# Patient Record
Sex: Female | Born: 1955 | Race: White | Hispanic: No | Marital: Married | Smoking: Never smoker
Health system: Southern US, Community
[De-identification: ages and names within clinical notes are randomized; demographics above are authoritative.]

## PROBLEM LIST (undated history)

## (undated) DIAGNOSIS — G4700 Insomnia, unspecified: Secondary | ICD-10-CM

## (undated) DIAGNOSIS — A048 Other specified bacterial intestinal infections: Secondary | ICD-10-CM

## (undated) DIAGNOSIS — J329 Chronic sinusitis, unspecified: Secondary | ICD-10-CM

## (undated) DIAGNOSIS — I8390 Asymptomatic varicose veins of unspecified lower extremity: Secondary | ICD-10-CM

## (undated) DIAGNOSIS — L209 Atopic dermatitis, unspecified: Secondary | ICD-10-CM

## (undated) DIAGNOSIS — K589 Irritable bowel syndrome without diarrhea: Secondary | ICD-10-CM

## (undated) DIAGNOSIS — I10 Essential (primary) hypertension: Secondary | ICD-10-CM

## (undated) HISTORY — DX: Chronic sinusitis, unspecified: J32.9

## (undated) HISTORY — DX: Irritable bowel syndrome, unspecified: K58.9

## (undated) HISTORY — DX: Insomnia, unspecified: G47.00

## (undated) HISTORY — PX: CATARACT EXTRACTION: SUR2

## (undated) HISTORY — DX: Atopic dermatitis, unspecified: L20.9

## (undated) HISTORY — DX: Asymptomatic varicose veins of unspecified lower extremity: I83.90

## (undated) HISTORY — DX: Other specified bacterial intestinal infections: A04.8

---

## 2002-10-02 ENCOUNTER — Other Ambulatory Visit: Admission: RE | Admit: 2002-10-02 | Discharge: 2002-10-02 | Payer: Self-pay | Admitting: Obstetrics and Gynecology

## 2002-10-25 ENCOUNTER — Encounter: Admission: RE | Admit: 2002-10-25 | Discharge: 2002-10-25 | Payer: Self-pay | Admitting: Obstetrics and Gynecology

## 2002-10-25 ENCOUNTER — Encounter: Payer: Self-pay | Admitting: Obstetrics and Gynecology

## 2005-02-04 ENCOUNTER — Other Ambulatory Visit: Admission: RE | Admit: 2005-02-04 | Discharge: 2005-02-04 | Payer: Self-pay | Admitting: Obstetrics and Gynecology

## 2005-11-30 ENCOUNTER — Encounter: Admission: RE | Admit: 2005-11-30 | Discharge: 2005-11-30 | Payer: Self-pay | Admitting: Obstetrics and Gynecology

## 2007-08-07 ENCOUNTER — Encounter: Admission: RE | Admit: 2007-08-07 | Discharge: 2007-08-07 | Payer: Self-pay | Admitting: Obstetrics and Gynecology

## 2008-10-02 IMAGING — MG MM DIGITAL SCREENING BILAT W/ CAD
4 series · 4 of 4 positions shown · non-contrast
Comparison: Prior studies.

DG SCREEN MAMMOGRAM BILATERAL
Bilateral CC and MLO view(s) were taken.
Technologist: Salinas Ambrose
Prior study comparison: October 25, 2002, bilateral screening mammogram.

DIGITAL SCREENING MAMMOGRAM WITH CAD:

[R CC]
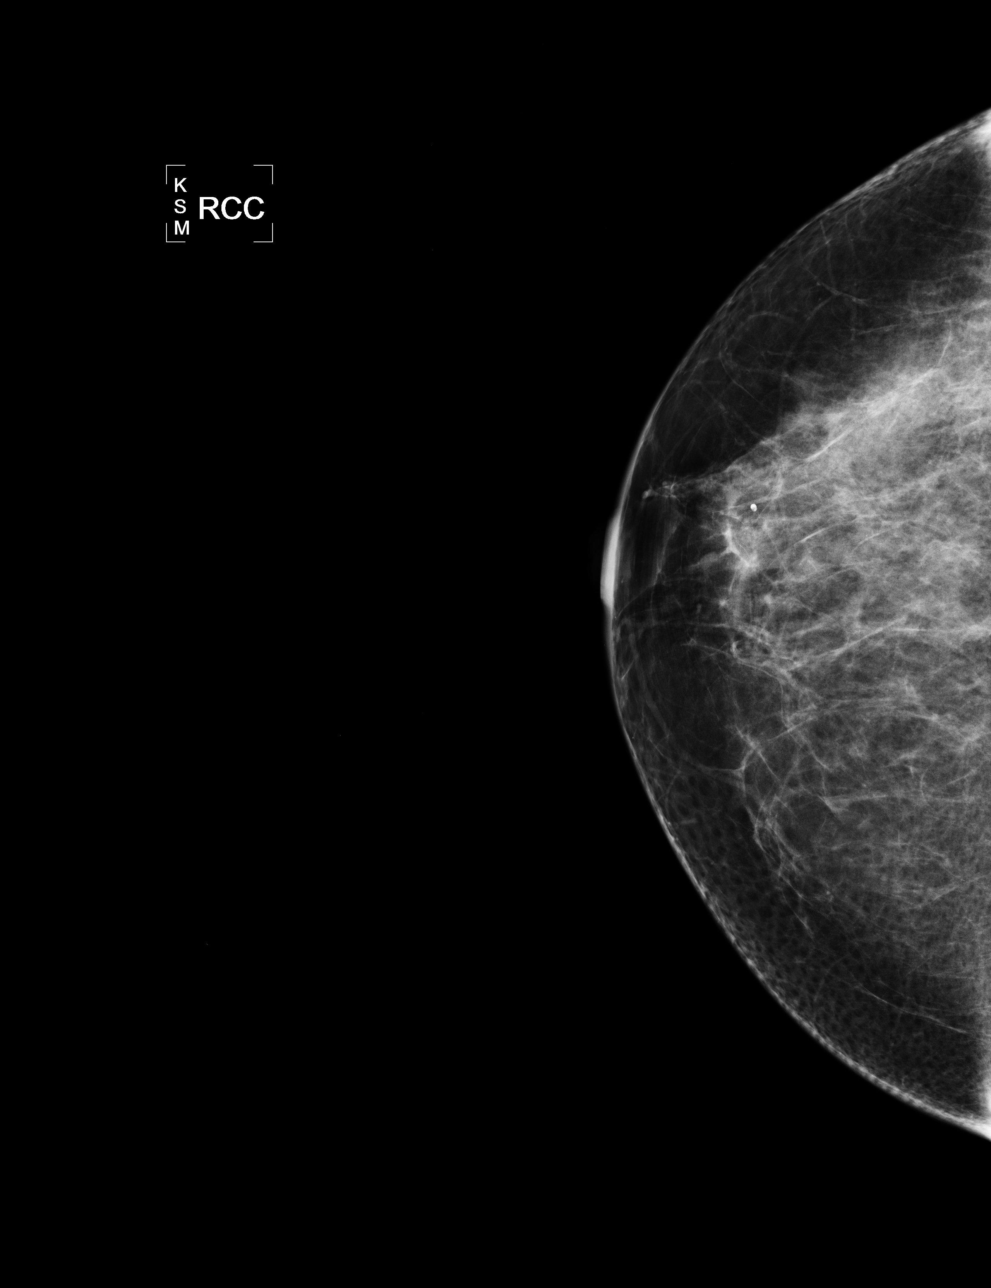

[L CC]
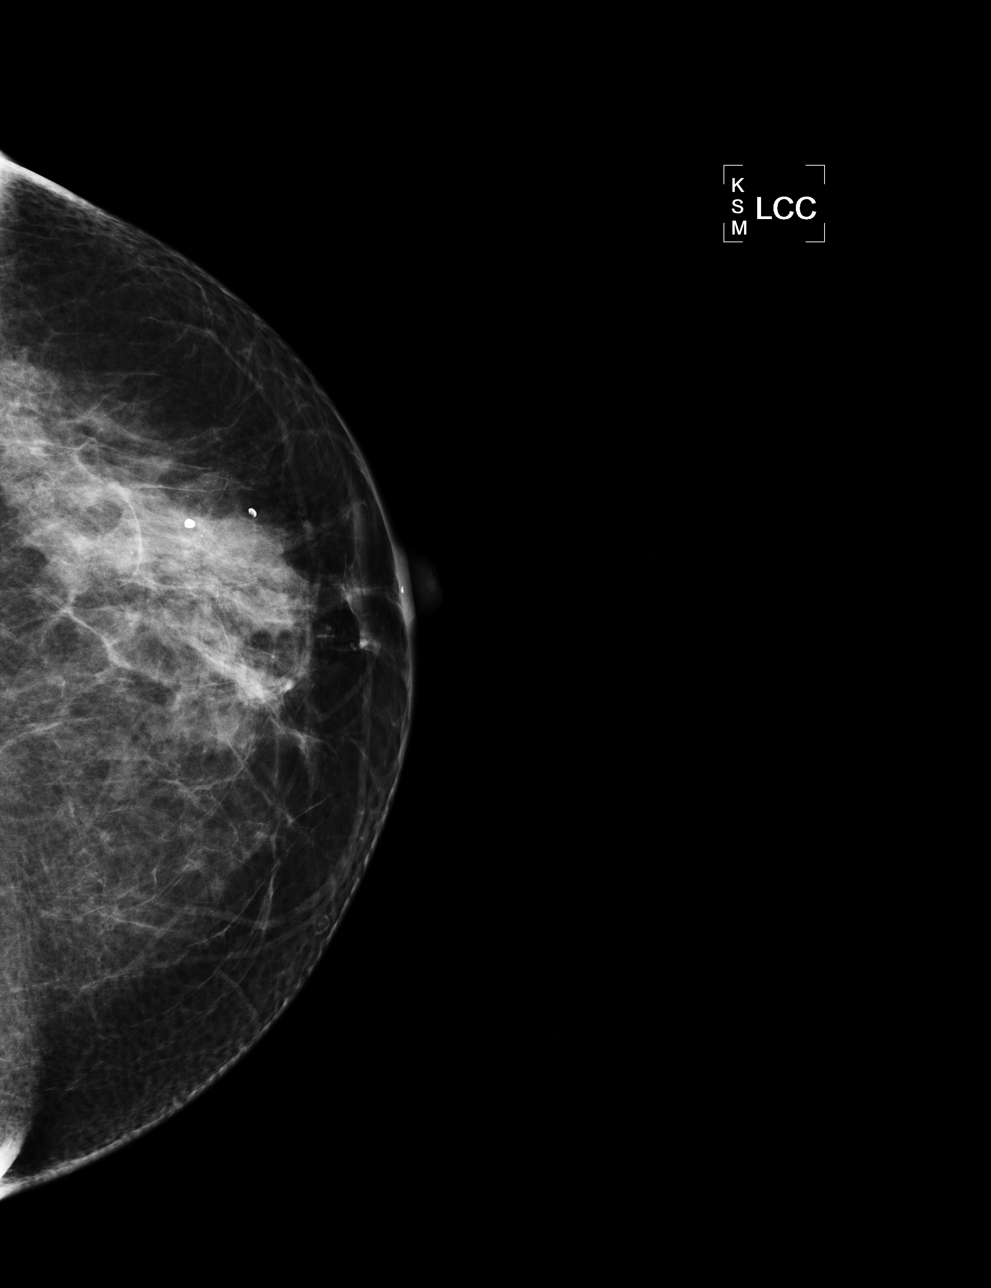

[L MLO]
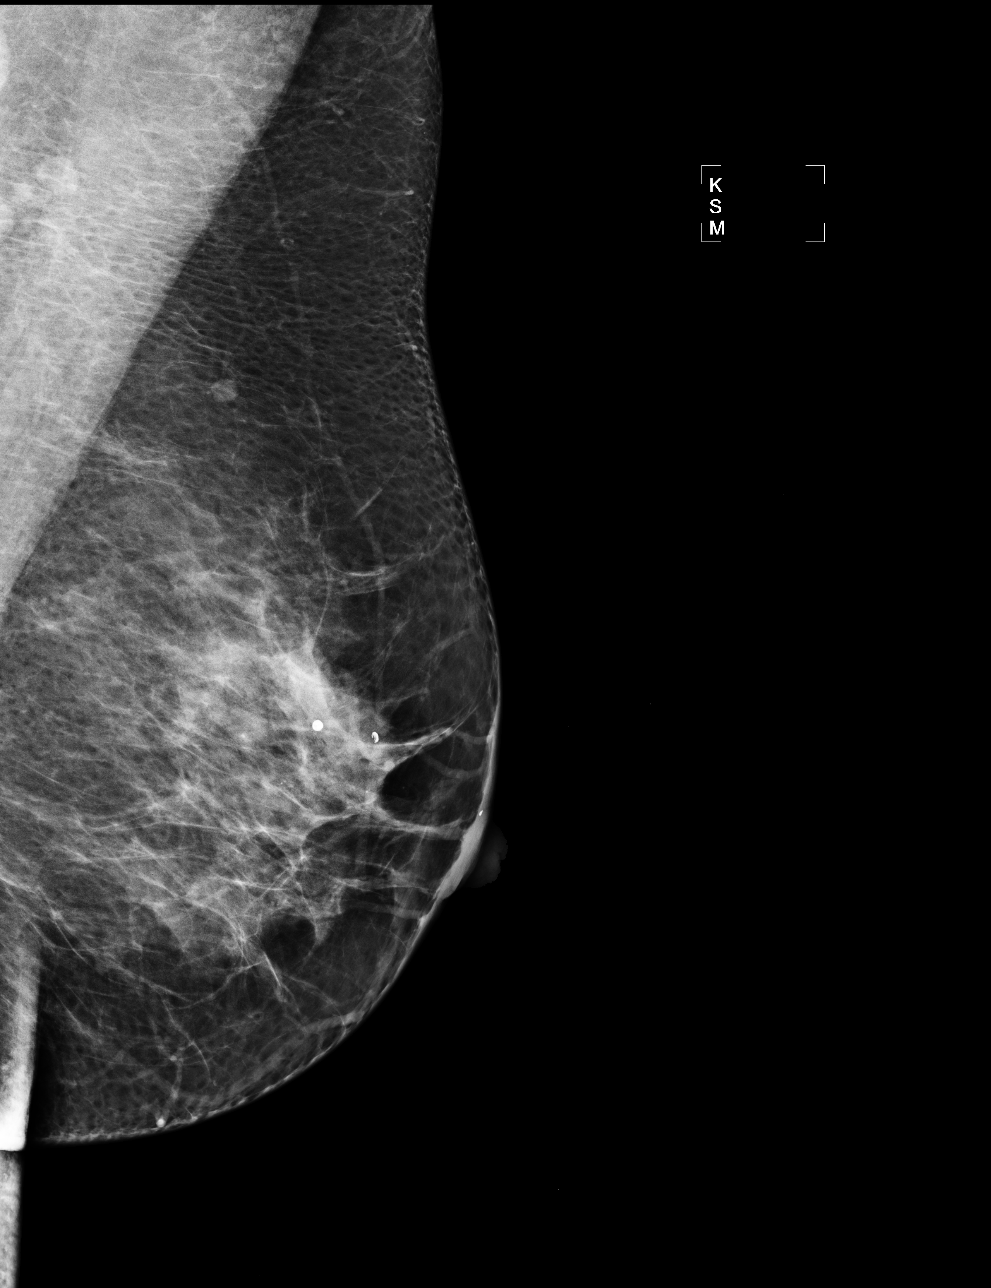

[R MLO]
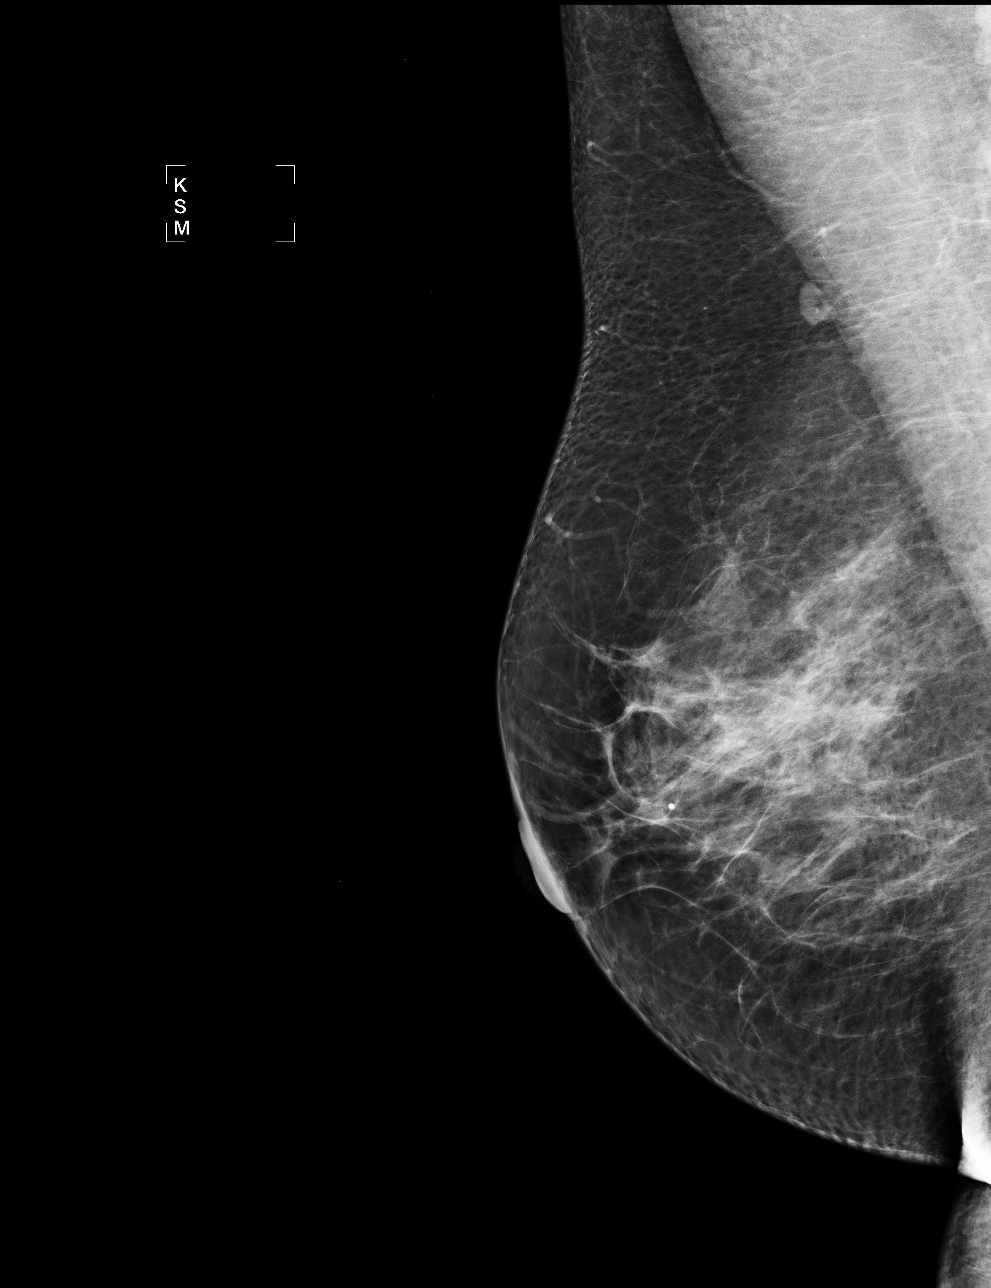

[4 of 4 positions shown; findings below may reference images not displayed]

The breast tissue is heterogeneously dense.  There is no dominant mass, architectural distortion or
calcification to suggest malignancy.
IMPRESSION: No mammographic evidence of malignancy.  Suggest yearly screening mammography.

ASSESSMENT: Negative - BI-RADS 1

Screening mammogram in 1 year.
ANALYZED BY COMPUTER AIDED DETECTION. , THIS PROCEDURE WAS A DIGITAL MAMMOGRAM.

## 2010-03-16 ENCOUNTER — Encounter: Admission: RE | Admit: 2010-03-16 | Discharge: 2010-03-16 | Payer: Self-pay | Admitting: Obstetrics and Gynecology

## 2010-10-17 ENCOUNTER — Encounter: Payer: Self-pay | Admitting: Obstetrics and Gynecology

## 2011-05-13 IMAGING — MG MM DIGITAL SCREENING BILAT W/ CAD
6 series · 6 of 6 positions shown · non-contrast
Comparison: none

DG SCREEN MAMMOGRAM BILATERAL
Bilateral CC and MLO view(s) were taken.

DIGITAL SCREENING MAMMOGRAM WITH CAD:
There are scattered fibroglandular densities.  No masses or malignant type calcifications are 
identified.  Compared with prior studies.
Images were processed with CAD.

[R CC]
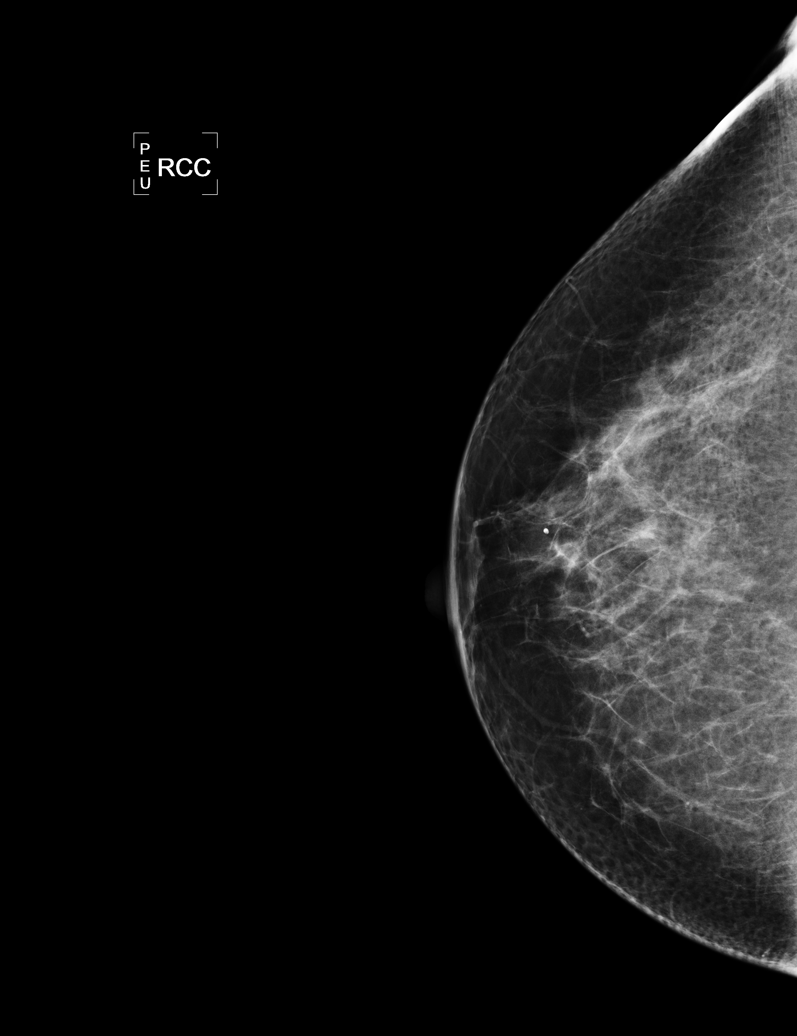

[L CC (1 of 2)]
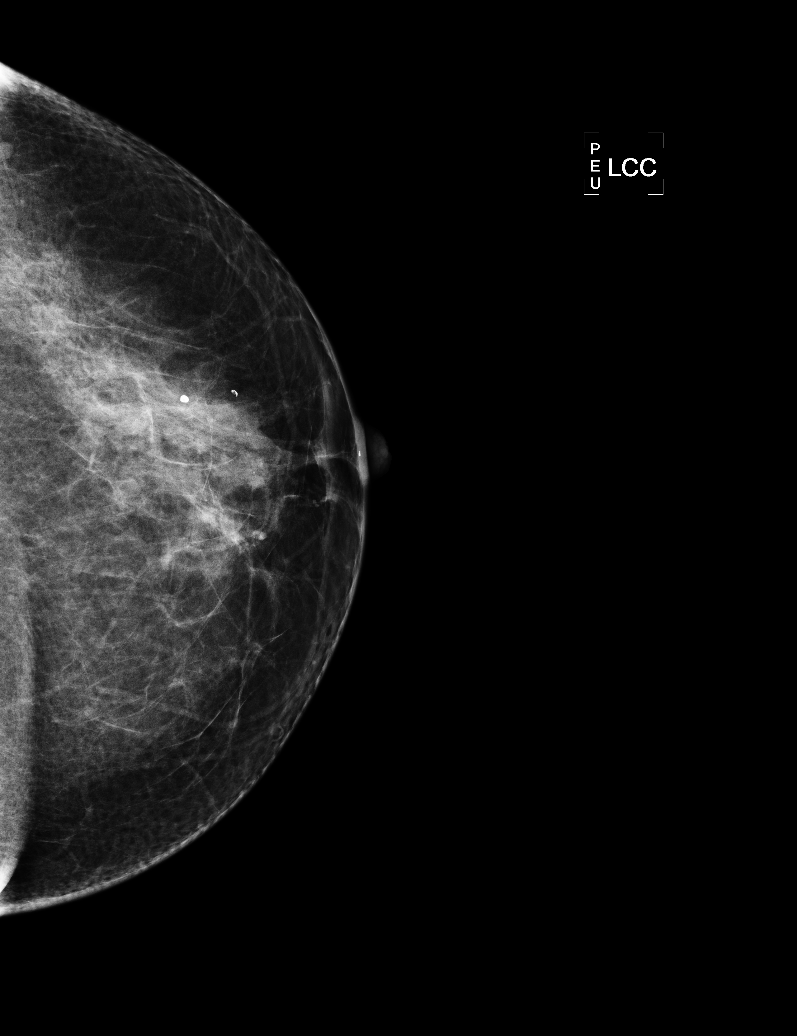

[L MLO (1 of 2)]
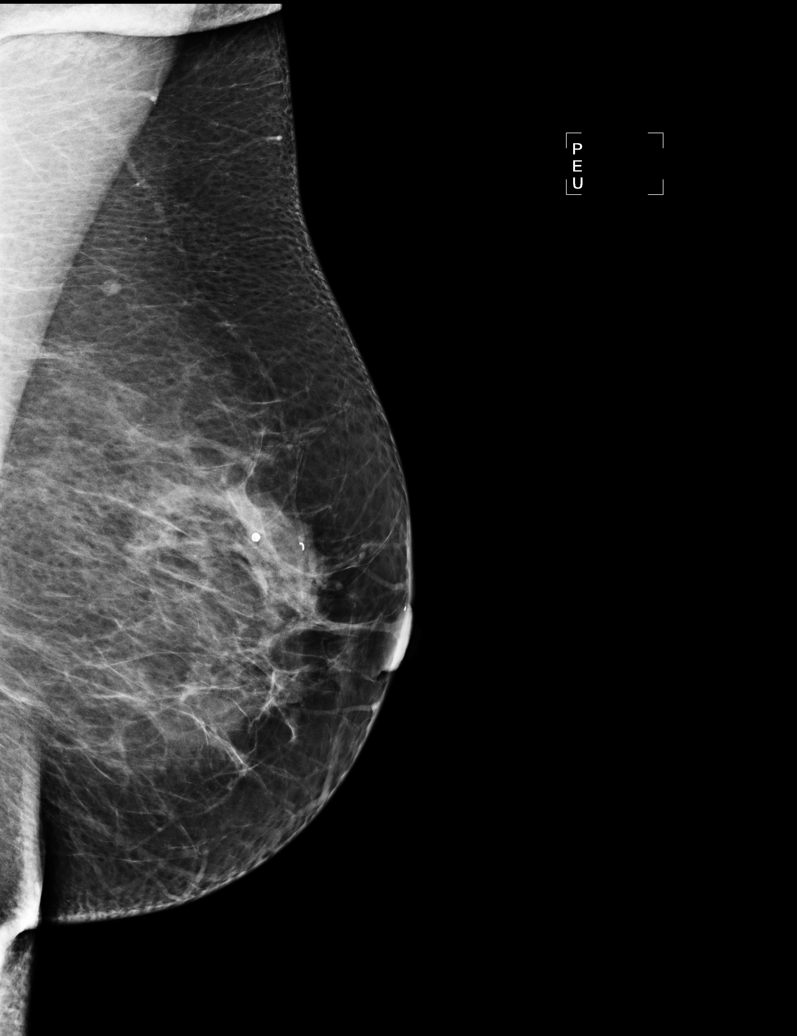

[R MLO]
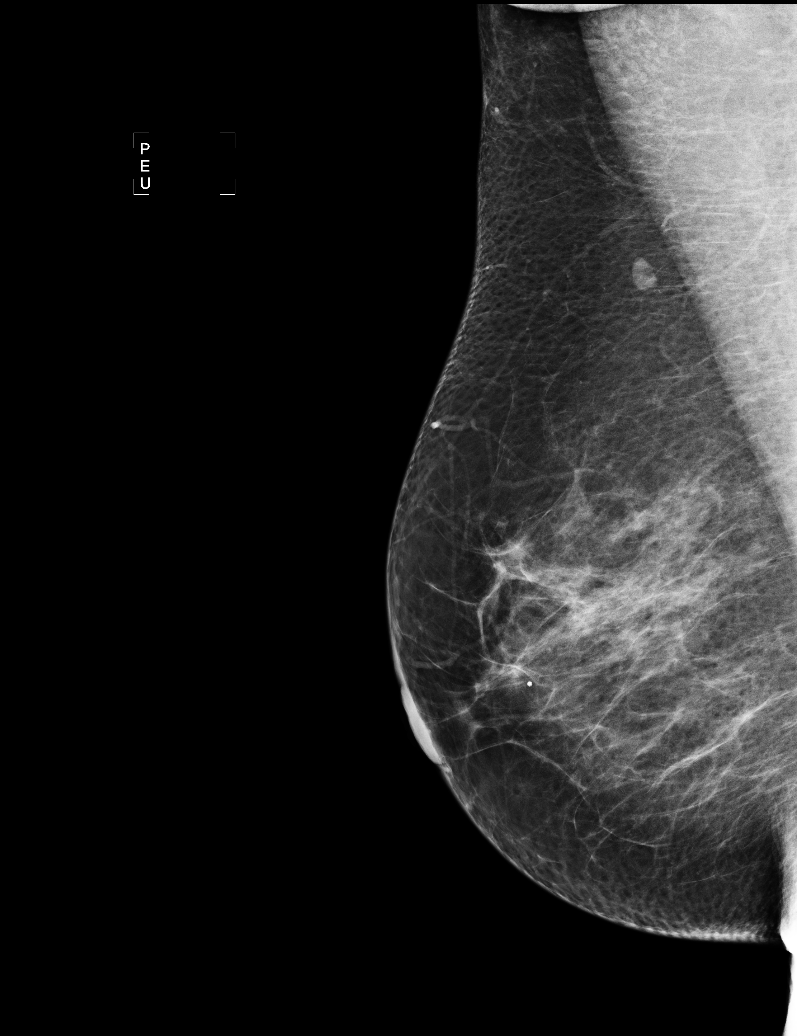

[L MLO (2 of 2)]
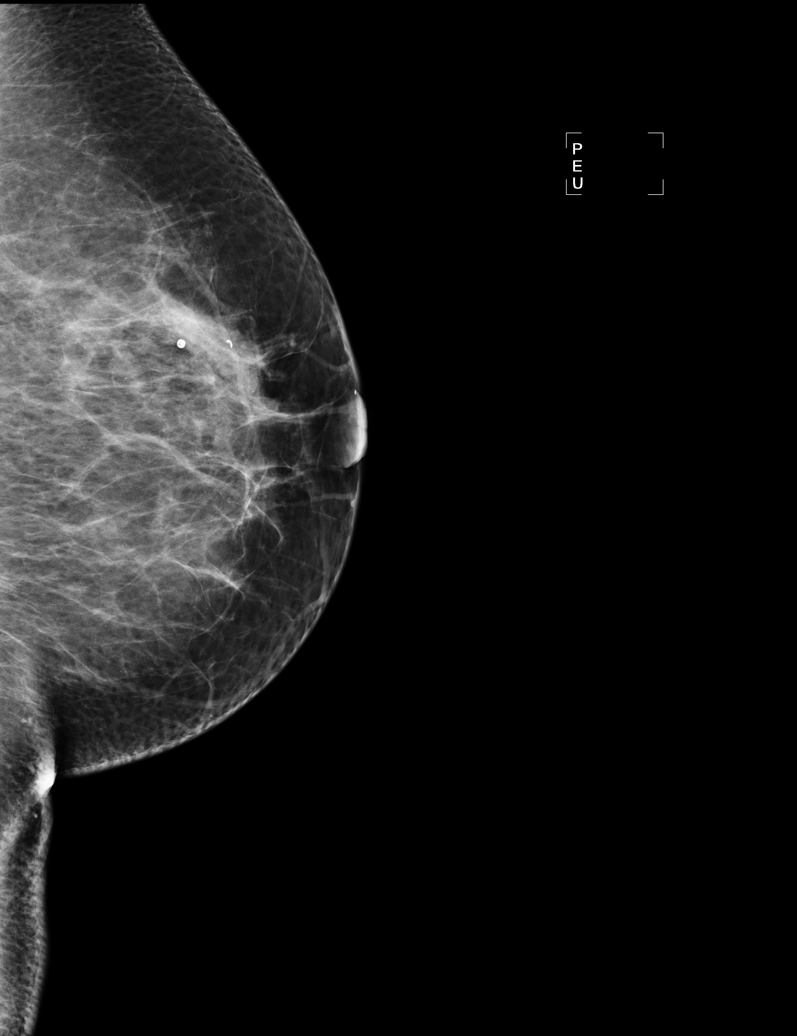

[L CC (2 of 2)]
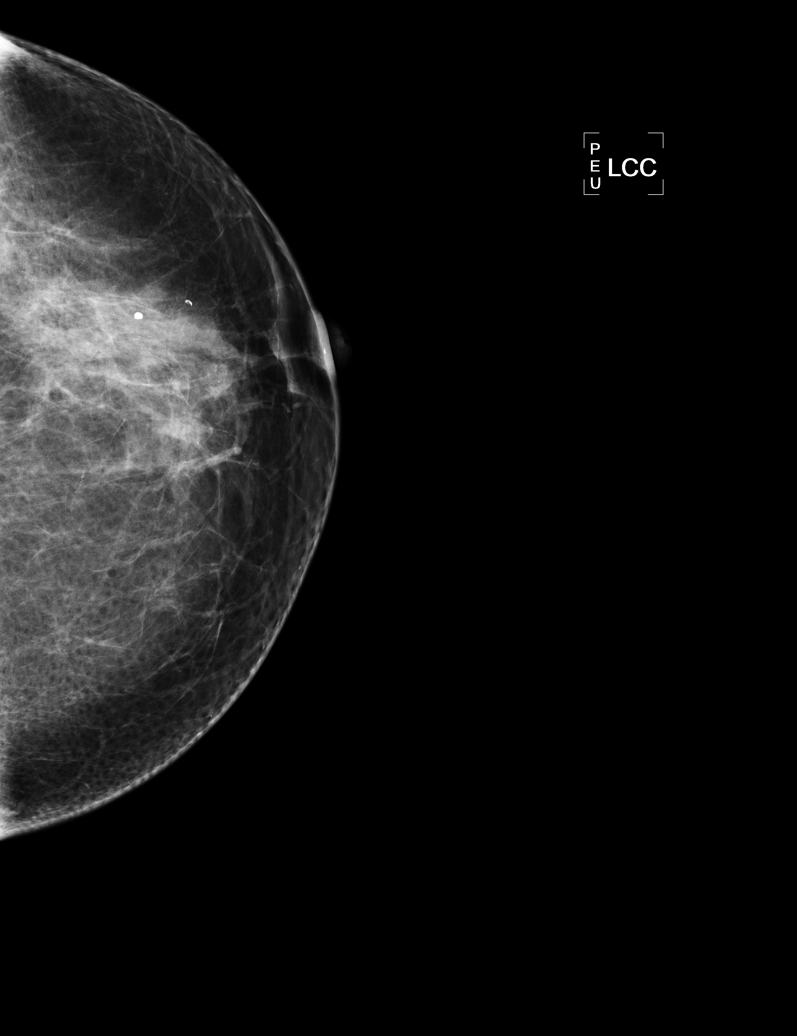

[6 of 6 positions shown; findings below may reference images not displayed]

IMPRESSION: No specific mammographic evidence of malignancy.  Next screening mammogram is recommended in one 
year.

A result letter of this screening mammogram will be mailed directly to the patient.

ASSESSMENT: Negative - BI-RADS 1

Screening mammogram in 1 year.
,

## 2013-01-09 HISTORY — PX: COLONOSCOPY: SHX5424

## 2013-01-09 HISTORY — PX: ESOPHAGOGASTRODUODENOSCOPY: SHX1529

## 2013-05-31 ENCOUNTER — Emergency Department (HOSPITAL_COMMUNITY)
Admission: EM | Admit: 2013-05-31 | Discharge: 2013-05-31 | Disposition: A | Discharge status: Home / Self Care | Payer: PRIVATE HEALTH INSURANCE | Attending: Emergency Medicine | Admitting: Emergency Medicine

## 2013-05-31 ENCOUNTER — Encounter (HOSPITAL_COMMUNITY): Payer: Self-pay | Admitting: Emergency Medicine

## 2013-05-31 DIAGNOSIS — Y9389 Activity, other specified: Secondary | ICD-10-CM | POA: Insufficient documentation

## 2013-05-31 DIAGNOSIS — I10 Essential (primary) hypertension: Secondary | ICD-10-CM | POA: Insufficient documentation

## 2013-05-31 DIAGNOSIS — T7840XA Allergy, unspecified, initial encounter: Secondary | ICD-10-CM

## 2013-05-31 DIAGNOSIS — L299 Pruritus, unspecified: Secondary | ICD-10-CM | POA: Insufficient documentation

## 2013-05-31 DIAGNOSIS — T63461A Toxic effect of venom of wasps, accidental (unintentional), initial encounter: Secondary | ICD-10-CM | POA: Insufficient documentation

## 2013-05-31 DIAGNOSIS — Y929 Unspecified place or not applicable: Secondary | ICD-10-CM | POA: Insufficient documentation

## 2013-05-31 DIAGNOSIS — R21 Rash and other nonspecific skin eruption: Secondary | ICD-10-CM | POA: Insufficient documentation

## 2013-05-31 HISTORY — DX: Essential (primary) hypertension: I10

## 2013-05-31 MED ORDER — KETOROLAC TROMETHAMINE 30 MG/ML IJ SOLN
30.0000 mg | Freq: Once | INTRAMUSCULAR | Status: AC
  Administered 2013-05-31: 30 mg via INTRAVENOUS
  Filled 2013-05-31: qty 1

## 2013-05-31 MED ORDER — SODIUM CHLORIDE 0.9 % IV BOLUS (SEPSIS)
1000.0000 mL | Freq: Once | INTRAVENOUS | Status: AC
  Administered 2013-05-31: 1000 mL via INTRAVENOUS

## 2013-05-31 MED ORDER — DIPHENHYDRAMINE HCL 25 MG PO CAPS: 25.0000 mg | ORAL_CAPSULE | Freq: Four times a day (QID) | ORAL | Status: DC | PRN

## 2013-05-31 MED ORDER — EPINEPHRINE 0.3 MG/0.3ML IJ SOAJ
0.3000 mg | Freq: Once | INTRAMUSCULAR | Status: AC
  Administered 2013-05-31: 0.3 mg via INTRAMUSCULAR

## 2013-05-31 MED ORDER — EPINEPHRINE 0.3 MG/0.3ML IJ SOAJ
INTRAMUSCULAR | Status: AC
  Administered 2013-05-31: 0.3 mg via INTRAMUSCULAR
  Filled 2013-05-31: qty 0.3

## 2013-05-31 MED ORDER — METOCLOPRAMIDE HCL 5 MG/ML IJ SOLN
10.0000 mg | Freq: Once | INTRAMUSCULAR | Status: AC
  Administered 2013-05-31: 10 mg via INTRAVENOUS
  Filled 2013-05-31: qty 2

## 2013-05-31 MED ORDER — METHYLPREDNISOLONE SODIUM SUCC 125 MG IJ SOLR
125.0000 mg | Freq: Once | INTRAMUSCULAR | Status: AC
  Administered 2013-05-31: 125 mg via INTRAVENOUS
  Filled 2013-05-31: qty 2

## 2013-05-31 MED ORDER — EPINEPHRINE 0.3 MG/0.3ML IJ SOAJ: 0.3000 mg | INTRAMUSCULAR | Status: AC | PRN

## 2013-05-31 MED ORDER — PREDNISONE 50 MG PO TABS: 50.0000 mg | ORAL_TABLET | Freq: Every day | ORAL | Status: DC

## 2013-05-31 MED ORDER — FAMOTIDINE IN NACL 20-0.9 MG/50ML-% IV SOLN
20.0000 mg | Freq: Once | INTRAVENOUS | Status: AC
  Administered 2013-05-31: 20 mg via INTRAVENOUS
  Filled 2013-05-31: qty 50

## 2013-05-31 NOTE — ED Provider Notes (Signed)
CSN: [id number]     Arrival date & time 05/31/13  1551 History   First MD Initiated Contact with Patient 05/31/13 1600     Chief Complaint  Patient presents with  . Allergic Reaction   (Consider location/radiation/quality/duration/timing/severity/associated sxs/prior Treatment) HPI Comments: Pt comes in with cc of allergies. Pt has hx of HTN, no hx of allergies. States that about an hour ago, she was bit by a yellow jacket - and then, while she was driving, she started having pruritis in her feet and hands. She then noted rah spreading all over her body, and having a "funny" sensation in her mouth. She denies any wheezing. No new meds.  Patient is a 57 y.o. female presenting with allergic reaction. The history is provided by the patient.  Allergic Reaction Presenting symptoms: rash   Presenting symptoms: no wheezing     Past Medical History  Diagnosis Date  . Hypertension    Past Surgical History  Procedure Laterality Date  . Cesarean section     No family history on file. History  Substance Use Topics  . Smoking status: Never Smoker   . Smokeless tobacco: Never Used  . Alcohol Use: Yes     Comment: Socially    OB History   Grav Para Term Preterm Abortions TAB SAB Ect Mult Living                 Review of Systems  Constitutional: Negative for activity change.  HENT: Negative for facial swelling and neck pain.   Respiratory: Negative for cough, shortness of breath, wheezing and stridor.   Cardiovascular: Negative for chest pain.  Gastrointestinal: Negative for nausea, vomiting, abdominal pain, diarrhea, constipation, blood in stool and abdominal distention.  Genitourinary: Negative for hematuria and difficulty urinating.  Skin: Positive for rash. Negative for color change.  Neurological: Negative for speech difficulty.  Hematological: Does not bruise/bleed easily.  Psychiatric/Behavioral: Negative for confusion.    Allergies  Review of patient's allergies indicates  not on file.  Home Medications  No current outpatient prescriptions on file. BP 135/92  Pulse 100  Temp(Src) 97.6 F (36.4 C) (Oral)  Resp 16  SpO2 97% Physical Exam  Nursing note and vitals reviewed. Constitutional: She is oriented to person, place, and time. She appears well-developed and well-nourished.  HENT:  Head: Normocephalic and atraumatic.  Mouth/Throat: Oropharynx is clear and moist.  Oral exam is norma, no edema noted.  Eyes: EOM are normal. Pupils are equal, round, and reactive to light.  Neck: Neck supple.  Cardiovascular: Normal rate, regular rhythm and normal heart sounds.   No murmur heard. Pulmonary/Chest: Effort normal. No stridor. No respiratory distress. She has no wheezes.  Abdominal: Soft. She exhibits no distension. There is no tenderness. There is no rebound and no guarding.  Neurological: She is alert and oriented to person, place, and time.  Skin: Skin is warm and dry. Rash noted.  Diffuse erythematous, blanching rash from head to toe.    ED Course  Procedures (including critical care time) Labs Review Labs Reviewed - No data to display Imaging Review No results found.  MDM  No diagnosis found.  Pt comes in with cc of allergic reaction. No new meds, was stung by a Yellow Jacket prior to her symptoms starting.  Has some oral symptoms, but the visible oral exam is normal. Airway is protected. Will monitor closely.  Epipen, Solumedrol, Pepcid given, patient took 50 mg benadryl po at home.   Derwood Kaplan, MD 05/31/13 1642

## 2013-05-31 NOTE — Progress Notes (Signed)
Patient confirms his pcp is Dr. Sudie Bailey is Katie Pacheco.

## 2013-05-31 NOTE — ED Notes (Addendum)
Pt reports being stung by a yellow jacket 45 minutes ago. Pt reports difficulty breathing and reports that she feels like her tongue is swollen. Pt is talking in full sentences and has oxygen saturation of 97%. Pt has hives and redness on arms, face, neck, back, and legs. Pt is A/O x4. Pt reports taking 50 mg of benadryl PO prior to arrival.

## 2022-05-17 ENCOUNTER — Encounter: Payer: Self-pay | Admitting: Gastroenterology

## 2022-07-15 ENCOUNTER — Encounter: Payer: Self-pay | Admitting: Gastroenterology

## 2022-07-15 ENCOUNTER — Ambulatory Visit (INDEPENDENT_AMBULATORY_CARE_PROVIDER_SITE_OTHER): Payer: Commercial Managed Care - PPO | Admitting: Gastroenterology

## 2022-07-15 VITALS — BP 136/82 | HR 83 | Ht 63.0 in | Wt 161.1 lb

## 2022-07-15 DIAGNOSIS — K2970 Gastritis, unspecified, without bleeding: Secondary | ICD-10-CM

## 2022-07-15 DIAGNOSIS — Z1211 Encounter for screening for malignant neoplasm of colon: Secondary | ICD-10-CM

## 2022-07-15 DIAGNOSIS — B9681 Helicobacter pylori [H. pylori] as the cause of diseases classified elsewhere: Secondary | ICD-10-CM | POA: Diagnosis not present

## 2022-07-15 DIAGNOSIS — R1013 Epigastric pain: Secondary | ICD-10-CM

## 2022-07-15 DIAGNOSIS — Z1212 Encounter for screening for malignant neoplasm of rectum: Secondary | ICD-10-CM

## 2022-07-15 NOTE — Patient Instructions (Signed)
_______________________________________________________  If you are age 67 or older, your body mass index should be between 23-30. Your Body mass index is 28.54 kg/m. If this is out of the aforementioned range listed, please consider follow up with your Primary Care Provider.  If you are age 89 or younger, your body mass index should be between 19-25. Your Body mass index is 28.54 kg/m. If this is out of the aformentioned range listed, please consider follow up with your Primary Care Provider.   ________________________________________________________  The [facility] providers would like to encourage you to use [facility] to communicate with providers for non-urgent requests or questions.  Due to long hold times on the telephone, sending your provider a message by [facility] may be a faster and more efficient way to get a response.  Please allow 48 business hours for a response.  Please remember that this is for non-urgent requests.  _______________________________________________________  Katie Pacheco have been scheduled for an endoscopy and colonoscopy. Please follow the written instructions given to you at your visit today. Please pick up your prep supplies at the pharmacy within the next 1-3 days. If you use inhalers (even only as needed), please bring them with you on the day of your procedure.  Continue omeprazole  We have given you samples of the following medication to take: Clenpiq Lot [id number] Exp date 08-2023  Please call with any questions or concerns.  Thank you,  Dr. Jackquline Denmark

## 2022-07-15 NOTE — Progress Notes (Signed)
Chief Complaint:   Referring Provider:  Milana Kidney )PCP)   ASSESSMENT AND PLAN;   #1. HP gastritis/epi pain  #2. IBS-C  #3. CRC screening  Plan: -EGD/colon in Nov 2023 -Blood tests from [facility] -Continue omeprazole 40 QD -Continue Linzess 145 mcg p.o. daily and Mg 500/day. -If still with problems, [place] abdo followed by CT AP.   I discussed EGD/Colonoscopy- the indications, risks, alternatives and potential complications including, but not limited to bleeding, infection, reaction to meds, damage to internal organs, cardiac and/or pulmonary problems, and perforation requiring surgery. The possibility that significant findings could be missed was explained. All ? were answered. Pt consents to proceed. HPI:    Katie Pacheco is a 66 y.o. female  Well-known to me  HP + breath test 2 weeks Amox/biaxin/omep 40 BID x next week  Going on for while  Contipation  Heatburn- better No N/V  Chronic constipation- linzees 193mg po QD.  Talkes Mg 500 QHS  Blood work     Past GI work-up:  EGD 01/09/2013: Erosive esophagitis (LA grade B), mild gastroduodenitis. Bx-esophagitis.  Negative for Barrett's.  Colonoscopy 01/09/2013 (PC[facility]) -Colonic polyp s/p polypectomy.  Cecal polyp-hyperplastic -Moderate predominantly left colonic diverticulosis -Small internal hemorrhoids  SH-husband Dr. CNila Nephew urologist.  Daughters- [place] and [place], about to get married to [place] Past Medical History:  Diagnosis Date   Atopic dermatitis    Chronic sinusitis    H. pylori infection    Hypertension    IBS (irritable bowel syndrome)    Insomnia    Varicose veins of lower extremity     Past Surgical History:  Procedure Laterality Date   CATARACT EXTRACTION     x2   CESAREAN SECTION     x2   COLONOSCOPY  01/09/2013   Colonic polyp status post polypectomy. Moderate predominantly left colonic diverticulosis. Small internal hemorrhoids    ESOPHAGOGASTRODUODENOSCOPY  01/09/2013   Erosive Esophagitis (LA grade B)  Mild gastroduodentitis.    Family History  Problem Relation Age of Onset   Colon cancer Mother 953      right ascending upper   Lung cancer Sister     Social History   Tobacco Use   Smoking status: Never   Smokeless tobacco: Never  Vaping Use   Vaping Use: Never used  Substance Use Topics   Alcohol use: Yes    Comment: rare   Drug use: No    Current Outpatient Medications  Medication Sig Dispense Refill   amoxicillin (AMOXIL) 500 MG tablet Take 1,000 mg by mouth 2 (two) times daily.     clarithromycin (BIAXIN) 500 MG tablet Take 500 mg by mouth 2 (two) times daily.     EPINEPHrine (EPIPEN) 0.3 mg/0.3 mL SOAJ injection Inject 0.3 mLs (0.3 mg total) into the muscle as needed. 2 Device 1   ibuprofen (ADVIL,MOTRIN) 200 MG tablet Take 400 mg by mouth every 6 (six) hours as needed for pain.     LINZESS 145 MCG CAPS capsule Take 145 mcg by mouth every morning.     olmesartan (BENICAR) 20 MG tablet Take 20 mg by mouth daily in the afternoon.     omeprazole (PRILOSEC) 40 MG capsule Take 40 mg by mouth 2 (two) times daily.     No current facility-administered medications for this visit.    Allergies  Allergen Reactions   Bee Venom Anaphylaxis and Itching    Review of Systems:  Constitutional: Denies fever,  chills, diaphoresis, appetite change and fatigue.  HEENT: Denies photophobia, eye pain, redness, hearing loss, ear pain, congestion, sore throat, rhinorrhea, sneezing, mouth sores, neck pain, neck stiffness and tinnitus.   Respiratory: Denies SOB, DOE, cough, chest tightness,  and wheezing.   Cardiovascular: Denies chest pain, palpitations and leg swelling.  Genitourinary: Denies dysuria, urgency, frequency, hematuria, flank pain and difficulty urinating.  Musculoskeletal: Denies myalgias, back pain, joint swelling, arthralgias and gait problem.  Skin: No rash.  Neurological: Denies dizziness,  seizures, syncope, weakness, light-headedness, numbness and headaches.  Hematological: Denies adenopathy. Easy bruising, personal or family bleeding history  Psychiatric/Behavioral: No anxiety or depression     Physical Exam:    BP 136/82   Pulse 83   Ht '5\' 3"'$  (1.6 m)   Wt 161 lb 2 oz (73.1 kg)   BMI 28.54 kg/m  Wt Readings from Last 3 Encounters:  07/15/22 161 lb 2 oz (73.1 kg)   Constitutional:  Well-developed, in no acute distress. Psychiatric: Normal mood and affect. Behavior is normal. HEENT: Pupils normal.  Conjunctivae are normal. No scleral icterus. Cardiovascular: Normal rate, regular rhythm. No edema Pulmonary/chest: Effort normal and breath sounds normal. No wheezing, rales or rhonchi. Abdominal: Soft, nondistended.  Mild epigastric and LLQ tenderness.  Bowel sounds active throughout. There are no masses palpable. No hepatomegaly. Rectal: Deferred Neurological: Alert and oriented to person place and time. Skin: Skin is warm and dry. No rashes noted.    Carmell Austria, MD 07/15/2022, 10:25 AM  Cc: Ernestene Kiel, MD

## 2022-08-09 ENCOUNTER — Ambulatory Visit (AMBULATORY_SURGERY_CENTER): Payer: Commercial Managed Care - PPO | Admitting: Gastroenterology

## 2022-08-09 ENCOUNTER — Encounter: Payer: Self-pay | Admitting: Gastroenterology

## 2022-08-09 VITALS — BP 131/78 | HR 66 | Temp 97.5°F | Resp 10 | Ht 63.0 in | Wt 161.0 lb

## 2022-08-09 DIAGNOSIS — Z8619 Personal history of other infectious and parasitic diseases: Secondary | ICD-10-CM

## 2022-08-09 DIAGNOSIS — K2970 Gastritis, unspecified, without bleeding: Secondary | ICD-10-CM

## 2022-08-09 DIAGNOSIS — R1013 Epigastric pain: Secondary | ICD-10-CM

## 2022-08-09 DIAGNOSIS — K449 Diaphragmatic hernia without obstruction or gangrene: Secondary | ICD-10-CM | POA: Diagnosis not present

## 2022-08-09 DIAGNOSIS — Z8719 Personal history of other diseases of the digestive system: Secondary | ICD-10-CM

## 2022-08-09 DIAGNOSIS — Z1211 Encounter for screening for malignant neoplasm of colon: Secondary | ICD-10-CM

## 2022-08-09 DIAGNOSIS — K31A0 Gastric intestinal metaplasia, unspecified: Secondary | ICD-10-CM | POA: Diagnosis not present

## 2022-08-09 DIAGNOSIS — D122 Benign neoplasm of ascending colon: Secondary | ICD-10-CM

## 2022-08-09 DIAGNOSIS — Z800 Family history of malignant neoplasm of digestive organs: Secondary | ICD-10-CM | POA: Diagnosis not present

## 2022-08-09 DIAGNOSIS — K219 Gastro-esophageal reflux disease without esophagitis: Secondary | ICD-10-CM

## 2022-08-09 DIAGNOSIS — K635 Polyp of colon: Secondary | ICD-10-CM

## 2022-08-09 MED ORDER — OMEPRAZOLE 40 MG PO CPDR: 40.0000 mg | DELAYED_RELEASE_CAPSULE | Freq: Every day | ORAL | 3 refills | Status: AC

## 2022-08-09 MED ORDER — SODIUM CHLORIDE 0.9 % IV SOLN: 500.0000 mL | Freq: Once | INTRAVENOUS | Status: DC

## 2022-08-09 MED ORDER — LINACLOTIDE 145 MCG PO CAPS: 145.0000 ug | ORAL_CAPSULE | Freq: Every day | ORAL | 4 refills | Status: AC

## 2022-08-09 NOTE — Progress Notes (Signed)
Pt's states no medical or surgical changes since previsit or office visit. 

## 2022-08-09 NOTE — Progress Notes (Signed)
To pacu, VSS. Report to [email or web address].tb 

## 2022-08-09 NOTE — Progress Notes (Signed)
Chief Complaint:   Referring Provider:  Milana Kidney )PCP)   ASSESSMENT AND PLAN;   #1. HP gastritis/epi pain  #2. IBS-C  #3. FH CRC (mom at age 67). CRC screening  Plan: -EGD/colon in Nov 2023 -Blood tests from [facility] -Continue omeprazole 40 QD after she is done with HP treatment. -Continue Linzess 145 mcg p.o. daily and Mg 500/day. -If any further problems, [place] abdo followed by CT AP.   I discussed EGD/Colonoscopy- the indications, risks, alternatives and potential complications including, but not limited to bleeding, infection, reaction to meds, damage to internal organs, cardiac and/or pulmonary problems, and perforation requiring surgery. The possibility that significant findings could be missed was explained. All ? were answered. Pt consents to proceed. HPI:    Katie Pacheco is a 66 y.o. female  Well-known to me  With postprandial epi pain, abdominal bloating with nausea x over 2 months. Evaluated by PCP and first found to have + HP breath test Currently on Amox/biaxin/omep 40 BID x until next week. Feels significantly better  No further abdominal pain.  Her heartburn is better as well.  No odynophagia or dysphagia.  No N/V  Has longstanding history of chronic constipation with pellet-like stools,Dx with IBS-C, better on Linzess 135mg po QD and Mg 500 QHS  Blood work from PCP was otherwise negative  No weight loss.    Past GI work-up:  EGD 01/09/2013: Erosive esophagitis (LA grade B), mild gastroduodenitis. Bx-esophagitis.  Negative for Barrett's.  Colonoscopy 01/09/2013 (PC[facility]) -Colonic polyp s/p polypectomy.  Cecal polyp-hyperplastic.  -Moderate predominantly left colonic diverticulosis -Small internal hemorrhoids  SH-husband Dr. CNila Nephew urologist.  Daughters- [place]rayton [place]man([place] and [place], about to get married to [place] Past Medical History:  Diagnosis Date   Atopic dermatitis    Chronic sinusitis    H. pylori  infection    Hypertension    IBS (irritable bowel syndrome)    Insomnia    Varicose veins of lower extremity     Past Surgical History:  Procedure Laterality Date   CATARACT EXTRACTION     x2   CESAREAN SECTION     x2   COLONOSCOPY  01/09/2013   Colonic polyp status post polypectomy. Moderate predominantly left colonic diverticulosis. Small internal hemorrhoids   ESOPHAGOGASTRODUODENOSCOPY  01/09/2013   Erosive Esophagitis (LA grade B)  Mild gastroduodentitis.    Family History  Problem Relation Age of Onset   Colon cancer Mother 973      right ascending upper   Lung cancer Sister     Social History   Tobacco Use   Smoking status: Never   Smokeless tobacco: Never  Vaping Use   Vaping Use: Never used  Substance Use Topics   Alcohol use: Yes    Comment: rare   Drug use: No    Current Outpatient Medications  Medication Sig Dispense Refill   olmesartan (BENICAR) 20 MG tablet Take 20 mg by mouth daily in the afternoon.     omeprazole (PRILOSEC) 40 MG capsule Take 40 mg by mouth 2 (two) times daily.     EPINEPHrine (EPIPEN) 0.3 mg/0.3 mL SOAJ injection Inject 0.3 mLs (0.3 mg total) into the muscle as needed. 2 Device 1   ibuprofen (ADVIL,MOTRIN) 200 MG tablet Take 400 mg by mouth every 6 (six) hours as needed for pain. (Patient not taking: Reported on 08/09/2022)     LINZESS 145 MCG CAPS capsule Take 145 mcg by mouth every morning.  Current Facility-Administered Medications  Medication Dose Route Frequency Provider Last Rate Last Admin   0.9 %  sodium chloride infusion  500 mL Intravenous Once Jackquline Denmark, MD        Allergies  Allergen Reactions   Bee Venom Anaphylaxis and Itching   Codeine    Tylenol [Acetaminophen]     Review of Systems:  Constitutional: Denies fever, chills, diaphoresis, appetite change and fatigue.  HEENT: Denies photophobia, eye pain, redness, hearing loss, ear pain, congestion, sore throat, rhinorrhea, sneezing, mouth sores, neck  pain, neck stiffness and tinnitus.   Respiratory: Denies SOB, DOE, cough, chest tightness,  and wheezing.   Cardiovascular: Denies chest pain, palpitations and leg swelling.  Genitourinary: Denies dysuria, urgency, frequency, hematuria, flank pain and difficulty urinating.  Musculoskeletal: Denies myalgias, back pain, joint swelling, arthralgias and gait problem.  Skin: No rash.  Neurological: Denies dizziness, seizures, syncope, weakness, light-headedness, numbness and headaches.  Hematological: Denies adenopathy. Easy bruising, personal or family bleeding history  Psychiatric/Behavioral: No anxiety or depression     Physical Exam:    BP (!) 144/79   Pulse 74   Temp (!) 97.5 F (36.4 C)   Ht '5\' 3"'$  (1.6 m)   Wt 161 lb (73 kg)   SpO2 99%   BMI 28.52 kg/m  Wt Readings from Last 3 Encounters:  08/09/22 161 lb (73 kg)  07/15/22 161 lb 2 oz (73.1 kg)   Constitutional:  Well-developed, in no acute distress. Psychiatric: Normal mood and affect. Behavior is normal. HEENT: Pupils normal.  Conjunctivae are normal. No scleral icterus. Cardiovascular: Normal rate, regular rhythm. No edema Pulmonary/chest: Effort normal and breath sounds normal. No wheezing, rales or rhonchi. Abdominal: Soft, nondistended.  Mild epigastric and LLQ tenderness.  Bowel sounds active throughout. There are no masses palpable. No hepatomegaly. Rectal: Deferred Neurological: Alert and oriented to person place and time. Skin: Skin is warm and dry. No rashes noted.    Carmell Austria, MD 08/09/2022, 3:14 PM  Cc: Ernestene Kiel, MD

## 2022-08-09 NOTE — Progress Notes (Signed)
Called to room to assist during endoscopic procedure.  Patient ID and intended procedure confirmed with present staff. Received instructions for my participation in the procedure from the performing physician.  

## 2022-08-09 NOTE — Op Note (Signed)
[name] Patient Name: Katie Pacheco Procedure Date: 08/09/2022 3:06 PM MRN: [id number] Endoscopist: Jackquline Denmark , MD, [id number] Age: 66 Referring MD:  Date of Birth: [birth year] Gender: Female Account #: [id number] Procedure:                Colonoscopy Indications:              Screening in patient at increased risk: Colorectal                            cancer in mother at age 87 Medicines:                Monitored Anesthesia Care Procedure:                Pre-Anesthesia Assessment:                           - Prior to the procedure, a History and Physical                            was performed, and patient medications and                            allergies were reviewed. The patient's tolerance of                            previous anesthesia was also reviewed. The risks                            and benefits of the procedure and the sedation                            options and risks were discussed with the patient.                            All questions were answered, and informed consent                            was obtained. Prior Anticoagulants: The patient has                            taken no anticoagulant or antiplatelet agents. ASA                            Grade Assessment: II - A patient with mild systemic                            disease. After reviewing the risks and benefits,                            the patient was deemed in satisfactory condition to                            undergo the procedure.  After obtaining informed consent, the colonoscope                            was passed under direct vision. Throughout the                            procedure, the patient's blood pressure, pulse, and                            oxygen saturations were monitored continuously. The                            [id number]-HQ190L Colonoscope was introduced through the                            anus and advanced to the 2 cm  into the ileum. The                            colonoscopy was performed without difficulty. The                            patient tolerated the procedure well. The quality                            of the bowel preparation was adequate to identify                            polyps. Some retained stool in the right colon and                            in some areas of the transverse and descending                            colon. Aggressive suctioning and aspiration was                            performed. Overall the examination was adequate.                            The terminal ileum, ileocecal valve, appendiceal                            orifice, and rectum were photographed. Scope In: 3:39:20 PM Scope Out: 3:54:10 PM Scope Withdrawal Time: 0 hours 11 minutes 19 seconds  Total Procedure Duration: 0 hours 14 minutes 50 seconds  Findings:                 A 2 mm polyp was found in the proximal ascending                            colon. The polyp was sessile. The polyp was removed  with a cold snare. Resection and retrieval were                            complete.                           Multiple medium-mouthed diverticula were found in                            the sigmoid colon, descending colon, transverse                            colon and ascending colon.                           Non-bleeding internal hemorrhoids were found during                            retroflexion. The hemorrhoids were small and Grade                            I (internal hemorrhoids that do not prolapse).                           The terminal ileum appeared normal.                           The exam was otherwise without abnormality on                            direct and retroflexion views. Complications:            No immediate complications. Estimated Blood Loss:     Estimated blood loss: none. Impression:               - One 2 mm polyp in the proximal ascending  colon,                            removed with a cold snare. Resected and retrieved.                           - Moderate pancolonic diverticulosis predominantly                            in the sigmoid colon.                           - Non-bleeding internal hemorrhoids.                           - The examined portion of the ileum was normal.                           - The examination was otherwise normal on direct  and retroflexion views. Recommendation:           - Patient has a contact number available for                            emergencies. The signs and symptoms of potential                            delayed complications were discussed with the                            patient. Return to normal activities tomorrow.                            Written discharge instructions were provided to the                            patient.                           - Resume previous diet.                           - Continue present medications.                           - Await pathology results.                           - Repeat colonoscopy in 5 years for screening                            purposes with 2 day prep.                           - The findings and recommendations were discussed                            with the patient's family. Jackquline Denmark, MD 08/09/2022 4:11:11 PM This report has been signed electronically.

## 2022-08-09 NOTE — Op Note (Signed)
[name] Procedure Date: 08/09/2022 3:18 PM MRN: [id number] Endoscopist: Jackquline Denmark , MD, [id number] Age: 66 Referring MD:  Date of Birth: 1956-09-18 Gender: Female Account #: [id number] Procedure:                Upper GI endoscopy Indications:              Epigastric abdominal pain with recent H/O HP. Medicines:                Monitored Anesthesia Care Procedure:                Pre-Anesthesia Assessment:                           - Prior to the procedure, a History and Physical                            was performed, and patient medications and                            allergies were reviewed. The patient's tolerance of                            previous anesthesia was also reviewed. The risks                            and benefits of the procedure and the sedation                            options and risks were discussed with the patient.                            All questions were answered, and informed consent                            was obtained. Prior Anticoagulants: The patient has                            taken no anticoagulant or antiplatelet agents. ASA                            Grade Assessment: II - A patient with mild systemic                            disease. After reviewing the risks and benefits,                            the patient was deemed in satisfactory condition to                            undergo the procedure.                           After obtaining informed consent, the endoscope was  passed under direct vision. Throughout the                            procedure, the patient's blood pressure, pulse, and                            oxygen saturations were monitored continuously. The                            Endoscope was introduced through the mouth, and                            advanced to the second part of duodenum. The upper                            GI  endoscopy was accomplished without difficulty.                            The patient tolerated the procedure well. Scope In: Scope Out: Findings:                 The examined esophagus was normal except irregular                            Z-line with salmon-colored mucosa projecting 1 cm                            above the GE junction (34 to 35 cm). A small                            transient hiatal hernia was noted. This was best                            visualized on full distention of the lower                            esophagus and on Valsalva. Biopsies were taken with                            a cold forceps for histology, directed by                            narrowband imaging. Estimated blood loss: none.                           Scattered moderate inflammation characterized by                            erythema, friability and linear erosions was found                            in the gastric body and in the gastric antrum.  Biopsies were taken with a cold forceps for                            histology using Sydney protocol.                           The examined duodenum was normal. Biopsies for                            histology were taken with a cold forceps for                            evaluation of celiac disease. Complications:            No immediate complications. Estimated Blood Loss:     Estimated blood loss: none. Impression:               -Irregular Z-line with salmon-colored mucosa                            projecting 1 cm above the GE junction (biopsied to                            r/o short segment Barrett's esophagus)                           -Small sliding hiatal hernia.                           -Moderate (resolving) gastritis. Biopsied. Recommendation:           - Patient has a contact number available for                            emergencies. The signs and symptoms of potential                             delayed complications were discussed with the                            patient. Return to normal activities tomorrow.                            Written discharge instructions were provided to the                            patient.                           - Resume previous diet.                           - Continue present medications including omeprazole                            40 mg p.o. daily                           -  No aspirin, ibuprofen, naproxen, or other                            non-steroidal anti-inflammatory drugs.                           - The findings and recommendations were discussed                            with Dr Nila Nephew. Jackquline Denmark, MD 08/09/2022 4:07:23 PM This report has been signed electronically.

## 2022-08-09 NOTE — Patient Instructions (Addendum)
- Patient has a contact number available for emergencies. The signs and symptoms of potential delayed complications were discussed with the patient. Return to normal activities tomorrow. Written discharge instructions were provided to the patient. - Resume previous diet. - Continue present medications. - Await pathology results. - Repeat colonoscopy in 5 years for screening purposes with 2 day prep. - The findings and recommendations were discussed with the patient's family. - Continue present medications including including omeprazole 40 mg p.o. daily - No aspirin, ibuprofen, naproxen, or other non-steroidal anti-inflammatory drugs. - The findings and recommendations were discussed with Dr Nila Nephew.    YOU HAD AN ENDOSCOPIC PROCEDURE TODAY AT [facility]:   Refer to the procedure report that was given to you for any specific questions about what was found during the examination.  If the procedure report does not answer your questions, please call your gastroenterologist to clarify.  If you requested that your care partner not be given the details of your procedure findings, then the procedure report has been included in a sealed envelope for you to review at your convenience later.  YOU SHOULD EXPECT: Some feelings of bloating in the abdomen. Passage of more gas than usual.  Walking can help get rid of the air that was put into your GI tract during the procedure and reduce the bloating. If you had a lower endoscopy (such as a colonoscopy or flexible sigmoidoscopy) you may notice spotting of blood in your stool or on the toilet paper. If you underwent a bowel prep for your procedure, you may not have a normal bowel movement for a few days.  Please Note:  You might notice some irritation and congestion in your nose or some drainage.  This is from the oxygen used during your procedure.  There is no need for concern and it should clear up in a day or so.  SYMPTOMS TO REPORT  IMMEDIATELY:  Following lower endoscopy (colonoscopy or flexible sigmoidoscopy):  Excessive amounts of blood in the stool  Significant tenderness or worsening of abdominal pains  Swelling of the abdomen that is new, acute  Fever of 100F or higher  Following upper endoscopy (EGD)  Vomiting of blood or coffee ground material  New chest pain or pain under the shoulder blades  Painful or persistently difficult swallowing  New shortness of breath  Fever of 100F or higher  Black, tarry-looking stools  For urgent or emergent issues, a gastroenterologist can be reached at any hour by calling [phone]. Do not use MyChart messaging for urgent concerns.    DIET:  We do recommend a small meal at first, but then you may proceed to your regular diet.  Drink plenty of fluids but you should avoid alcoholic beverages for 24 hours.  ACTIVITY:  You should plan to take it easy for the rest of today and you should NOT DRIVE or use heavy machinery until tomorrow (because of the sedation medicines used during the test).    FOLLOW UP: Our staff will call the number listed on your records the next business day following your procedure.  We will call around 7:15- 8:00 am to check on you and address any questions or concerns that you may have regarding the information given to you following your procedure. If we do not reach you, we will leave a message.     If any biopsies were taken you will be contacted by phone or by letter within the next 1-3 weeks.  Please call us  at [phone] if you have not heard about the biopsies in 3 weeks.    SIGNATURES/CONFIDENTIALITY: You and/or your care partner have signed paperwork which will be entered into your electronic medical record.  These signatures attest to the fact that that the information above on your After Visit Summary has been reviewed and is understood.  Full responsibility of the confidentiality of this discharge information lies with you and/or  your care-partner.

## 2022-08-10 ENCOUNTER — Telehealth: Payer: Self-pay | Admitting: *Deleted

## 2022-08-10 NOTE — Telephone Encounter (Signed)
  Follow up Call-     08/09/2022    3:01 PM  Call back number  Post procedure Call Back phone  # [phone]  Permission to leave phone message Yes     Patient questions:  Do you have a fever, pain , or abdominal swelling? No. Pain Score  0 *  Have you tolerated food without any problems? Yes.    Have you been able to return to your normal activities? Yes.    Do you have any questions about your discharge instructions: Diet   No. Medications  No. Follow up visit  No.  Do you have questions or concerns about your Care? No.  Actions: * If pain score is 4 or above: No action needed, pain <4.

## 2022-08-14 ENCOUNTER — Encounter: Payer: Self-pay | Admitting: Gastroenterology
